# Patient Record
Sex: Female | Born: 1960 | Race: White | Hispanic: No | Marital: Married | Smoking: Never smoker
Health system: Southern US, Community
[De-identification: ages and names within clinical notes are randomized; demographics above are authoritative.]

## PROBLEM LIST (undated history)

## (undated) DIAGNOSIS — I10 Essential (primary) hypertension: Secondary | ICD-10-CM

## (undated) HISTORY — PX: BREAST SURGERY: SHX581

---

## 2004-05-06 HISTORY — PX: AUGMENTATION MAMMAPLASTY: SUR837

## 2011-03-19 ENCOUNTER — Other Ambulatory Visit: Payer: Self-pay | Admitting: Nurse Practitioner

## 2011-03-19 DIAGNOSIS — Z1231 Encounter for screening mammogram for malignant neoplasm of breast: Secondary | ICD-10-CM

## 2011-03-28 ENCOUNTER — Other Ambulatory Visit: Payer: Self-pay

## 2011-03-28 ENCOUNTER — Emergency Department (HOSPITAL_COMMUNITY): Payer: Managed Care, Other (non HMO)

## 2011-03-28 ENCOUNTER — Emergency Department (HOSPITAL_COMMUNITY)
Admission: EM | Admit: 2011-03-28 | Discharge: 2011-03-28 | Disposition: A | Discharge status: Home / Self Care | Payer: Managed Care, Other (non HMO) | Attending: Emergency Medicine | Admitting: Emergency Medicine

## 2011-03-28 ENCOUNTER — Encounter: Payer: Self-pay | Admitting: *Deleted

## 2011-03-28 DIAGNOSIS — T50905A Adverse effect of unspecified drugs, medicaments and biological substances, initial encounter: Secondary | ICD-10-CM | POA: Insufficient documentation

## 2011-03-28 DIAGNOSIS — R079 Chest pain, unspecified: Secondary | ICD-10-CM | POA: Insufficient documentation

## 2011-03-28 DIAGNOSIS — R0602 Shortness of breath: Secondary | ICD-10-CM | POA: Insufficient documentation

## 2011-03-28 DIAGNOSIS — R259 Unspecified abnormal involuntary movements: Secondary | ICD-10-CM | POA: Insufficient documentation

## 2011-03-28 DIAGNOSIS — IMO0002 Reserved for concepts with insufficient information to code with codable children: Secondary | ICD-10-CM | POA: Insufficient documentation

## 2011-03-28 DIAGNOSIS — J45909 Unspecified asthma, uncomplicated: Secondary | ICD-10-CM | POA: Insufficient documentation

## 2011-03-28 DIAGNOSIS — K117 Disturbances of salivary secretion: Secondary | ICD-10-CM | POA: Insufficient documentation

## 2011-03-28 MED ORDER — SODIUM CHLORIDE 0.9 % IV BOLUS (SEPSIS)
1000.0000 mL | Freq: Once | INTRAVENOUS | Status: AC
  Administered 2011-03-28: 1000 mL via INTRAVENOUS

## 2011-03-28 MED ORDER — LORAZEPAM 1 MG PO TABS
0.5000 mg | ORAL_TABLET | Freq: Once | ORAL | Status: AC
  Administered 2011-03-28: 0.5 mg via ORAL
  Filled 2011-03-28: qty 1

## 2011-03-28 NOTE — ED Notes (Signed)
Pt discharged home. Had no further questions regarding instructions and follow up. Denies pain at time of discharge.

## 2011-03-28 NOTE — ED Notes (Signed)
Pt resting quietly at the time. Remains on cardiac monitor. States 2/10 chest pressure. Family at bedside. No signs of distress.

## 2011-03-28 NOTE — ED Provider Notes (Signed)
History    a generally healthy patient with no history of cardiac etiology is presenting to the ED with an acute onset of chest pressure and shortness of breath. Patient states she is experiencing a pressure sensation to the mid chest and a sensation that she can't catch her breath. She also complains of dry mouth, agitation, and body tremors. She denies fever, headache, nausea, vomiting, abdominal pain, back pain, dysuria, rash. She does admits to taking a new appetite suppressants, phentermine, for the past several days. She has taken a total of 5 pills in the span of a few days.  Patient states she has a history of mild asthma but it is well controlled. She denies taking birth control pill, recent surgery, recent travel, or prolonged bed rest.  CSN: [id number] Arrival date & time: 03/28/2011  5:38 PM   First MD Initiated Contact with Patient 03/28/11 1800      Chief Complaint  Patient presents with  . Chest Pain  . Shortness of Breath    (Consider location/radiation/quality/duration/timing/severity/associated sxs/prior treatment) HPI  Past Medical History  Diagnosis Date  . Asthma     History reviewed. No pertinent past surgical history.  No family history on file.  History  Substance Use Topics  . Smoking status: Never Smoker   . Smokeless tobacco: Not on file  . Alcohol Use: Yes    OB History    Grav Para Term Preterm Abortions TAB SAB Ect Mult Living                  Review of Systems  All other systems reviewed and are negative.    Allergies  Review of patient's allergies indicates no known allergies.  Home Medications   Current Outpatient Rx  Name Route Sig Dispense Refill  . ARGYLE LEFT VENTRICULAR CATH MISC Does not apply by Does not apply route.        BP 172/95  Pulse 99  Temp 98.1 F (36.7 C)  SpO2 100%  Physical Exam  Constitutional: She is oriented to person, place, and time. She appears well-developed and well-nourished. No distress.    HENT:  Head: Normocephalic and atraumatic.  Eyes: Conjunctivae are normal.  Neck: Normal range of motion. Neck supple.  Cardiovascular: Normal rate and regular rhythm.  Exam reveals no gallop and no friction rub.   No murmur heard. Pulmonary/Chest: Effort normal. No respiratory distress. She has no wheezes.  Abdominal: Soft. Bowel sounds are normal. There is no tenderness.  Musculoskeletal: Normal range of motion.  Neurological: She is alert and oriented to person, place, and time.    ED Course  Procedures (including critical care time)  Labs Reviewed - No data to display No results found.   No diagnosis found.   Date: 03/28/2011  Rate: 95  Rhythm: normal sinus rhythm  QRS Axis: normal  Intervals: normal  ST/T Wave abnormalities: normal  Conduction Disutrbances:none  Narrative Interpretation: mild tachycardia  Old EKG Reviewed: none available    MDM  Patient symptoms is likely related to reaction from the phentermine. Her EKG is essentially normal, and she is satting at 100%  on O2.  She is hypertensive, tachycardic. Supportive treatment with normal saline fluid, Ativan, and reassurance given. I will continue to monitor patient care. I have discussed this with my attending.   9:41 PM Pt is back to her normal baseline.  She felt comfortable to go home.  Her sxs is related to Phentermine.  I recommend d/c this med and f/u  with her PCP.  Pt voice understanding.      Fayrene Helper, PA 03/28/11 2142

## 2011-03-28 NOTE — ED Notes (Signed)
Patient with c/o midsternal chest pain that is radiating to he right arm and she states that her right arm is tingling.  Pain also states that she is experiencing shortness of breath.

## 2011-03-28 NOTE — ED Notes (Signed)
Pt presents to department for evaluation of midsternal chest pain radiating to R arm. Onset yesterday. Pt now states 3/10 "pressure" sensation. Onset yesterday. Also states SOB. Pt states she has recently starting taking a type of diet pill. States symptoms started after several days of taking these pills. Pt states "I feel like my heart is going to beat out of my chest sometimes." respirations unlabored. Skin warm and dry. Pt conscious alert and oriented x4. No signs of distress at present.

## 2011-03-29 NOTE — ED Provider Notes (Signed)
Medical screening examination/treatment/procedure(s) were performed by non-physician practitioner and as supervising physician I was immediately available for consultation/collaboration.   Geoffery Lyons, MD 03/29/11 [phone]

## 2011-04-16 ENCOUNTER — Ambulatory Visit
Admission: RE | Admit: 2011-04-16 | Discharge: 2011-04-16 | Disposition: A | Discharge status: Home / Self Care | Payer: Managed Care, Other (non HMO) | Source: Ambulatory Visit | Attending: Nurse Practitioner | Admitting: Nurse Practitioner

## 2011-04-16 DIAGNOSIS — Z1231 Encounter for screening mammogram for malignant neoplasm of breast: Secondary | ICD-10-CM

## 2013-04-22 ENCOUNTER — Emergency Department (HOSPITAL_COMMUNITY): Payer: Self-pay

## 2013-04-22 ENCOUNTER — Encounter (HOSPITAL_COMMUNITY): Payer: Self-pay | Admitting: Emergency Medicine

## 2013-04-22 ENCOUNTER — Emergency Department (HOSPITAL_COMMUNITY)
Admission: EM | Admit: 2013-04-22 | Discharge: 2013-04-22 | Disposition: A | Discharge status: Home / Self Care | Payer: Self-pay | Attending: Emergency Medicine | Admitting: Emergency Medicine

## 2013-04-22 DIAGNOSIS — J45909 Unspecified asthma, uncomplicated: Secondary | ICD-10-CM | POA: Insufficient documentation

## 2013-04-22 DIAGNOSIS — R0789 Other chest pain: Secondary | ICD-10-CM | POA: Insufficient documentation

## 2013-04-22 DIAGNOSIS — Z79899 Other long term (current) drug therapy: Secondary | ICD-10-CM | POA: Insufficient documentation

## 2013-04-22 DIAGNOSIS — F430 Acute stress reaction: Secondary | ICD-10-CM | POA: Insufficient documentation

## 2013-04-22 DIAGNOSIS — IMO0002 Reserved for concepts with insufficient information to code with codable children: Secondary | ICD-10-CM | POA: Insufficient documentation

## 2013-04-22 LAB — CBC
HCT: 44 % (ref 36.0–46.0)
Hemoglobin: 14.7 g/dL (ref 12.0–15.0)
MCH: 28.8 pg (ref 26.0–34.0)
MCHC: 33.4 g/dL (ref 30.0–36.0)
MCV: 86.3 fL (ref 78.0–100.0)
RDW: 13.1 % (ref 11.5–15.5)

## 2013-04-22 LAB — BASIC METABOLIC PANEL
BUN: 16 mg/dL (ref 6–23)
CO2: 24 mEq/L (ref 19–32)
Calcium: 10.3 mg/dL (ref 8.4–10.5)
GFR calc non Af Amer: >90 mL/min (ref >90)
Glucose, Bld: 102 mg/dL — ABNORMAL HIGH (ref 70–99)
Sodium: 138 mEq/L (ref 135–145)

## 2013-04-22 LAB — POCT I-STAT TROPONIN I

## 2013-04-22 MED ORDER — ASPIRIN 325 MG PO TABS
325.0000 mg | ORAL_TABLET | ORAL | Status: AC
  Administered 2013-04-22: 325 mg via ORAL
  Filled 2013-04-22: qty 1

## 2013-04-22 MED ORDER — IBUPROFEN 800 MG PO TABS: 800.0000 mg | ORAL_TABLET | Freq: Three times a day (TID) | ORAL | Status: DC | PRN

## 2013-04-22 NOTE — ED Notes (Signed)
Pt complains of chest pressure and sob since last night. Pt states pain is midline. Denies nausea or lightheadedness.

## 2013-04-22 NOTE — ED Provider Notes (Signed)
CSN: [id number]     Arrival date & time 04/22/13  1807 History   First MD Initiated Contact with Patient 04/22/13 2021     Chief Complaint  Patient presents with  . Chest Pain   (Consider location/radiation/quality/duration/timing/severity/associated sxs/prior Treatment) HPI Patient presents emergency department with a one-day history of chest discomfort in the center of her chest.  The patient, states, that it has been constant.  She feels nauseous take a deeper breath, but does not have true shortness of breath.  The patient, states she does not have any calf pain, nausea, vomiting, abdominal pain, back pain, headache, blurred vision, dizziness, weakness, neck pain, fever, cough, or syncope.  The patient, states she has been under increased amount of stress recently.  Patient's patient is a history of asthma, but has not felt any wheezing.  He states nothing seems to make her condition, better or worse.  The patient does not have any exertional symptoms Past Medical History  Diagnosis Date  . Asthma    History reviewed. No pertinent past surgical history. History reviewed. No pertinent family history. History  Substance Use Topics  . Smoking status: Never Smoker   . Smokeless tobacco: Not on file  . Alcohol Use: Yes   OB History   Grav Para Term Preterm Abortions TAB SAB Ect Mult Living                 Review of Systems All other systems negative except as documented in the HPI. All pertinent positives and negatives as reviewed in the HPI. Allergies  Review of patient's allergies indicates no known allergies.  Home Medications   Current Outpatient Rx  Name  Route  Sig  Dispense  Refill  . albuterol (PROVENTIL HFA;VENTOLIN HFA) 108 (90 BASE) MCG/ACT inhaler   Inhalation   Inhale 2 puffs into the lungs every 6 (six) hours as needed for wheezing or shortness of breath. For shortness of breath/wheezing         . Fluticasone-Salmeterol (ADVAIR) 250-50 MCG/DOSE AEPB    Inhalation   Inhale 1 puff into the lungs every 12 (twelve) hours as needed (for asthma). For wheezing/shortness of breath          BP 150/98  Pulse 70  Temp(Src) 98.5 F (36.9 C) (Oral)  Resp 20  SpO2 97% Physical Exam  Nursing note and vitals reviewed. Constitutional: She is oriented to person, place, and time. She appears well-developed and well-nourished. No distress.  HENT:  Head: Normocephalic and atraumatic.  Mouth/Throat: Oropharynx is clear and moist.  Eyes: Pupils are equal, round, and reactive to light.  Neck: Normal range of motion. Neck supple.  Cardiovascular: Normal rate, regular rhythm and normal heart sounds.  Exam reveals no gallop and no friction rub.   No murmur heard. Pulmonary/Chest: Effort normal and breath sounds normal. No respiratory distress. She exhibits no tenderness.  Neurological: She is alert and oriented to person, place, and time. She exhibits normal muscle tone. Coordination normal.  Skin: Skin is warm and dry. No erythema.    ED Course  Procedures (including critical care time) Labs Review Labs Reviewed  BASIC METABOLIC PANEL - Abnormal; Notable for the following:    Glucose, Bld 102 (*)    All other components within normal limits  CBC  PRO B NATRIURETIC PEPTIDE  POCT I-STAT TROPONIN I  POCT I-STAT TROPONIN I   Imaging Review Dg Chest 2 View  04/22/2013   CLINICAL DATA:  Chest pain  EXAM: CHEST  2  VIEW  COMPARISON:  None.  FINDINGS: There is no edema or consolidation. There is slight scarring in the left upper lobe. Heart size and pulmonary vascularity are normal. No adenopathy. There is lower thoracic and upper lumbar levoscoliosis with rotatory component.  IMPRESSION: Marked scoliosis. No edema or consolidation. Mild scarring left upper lobe.   Electronically Signed   By: Bretta Bang M.D.   On: 04/22/2013 18:57    EKG Interpretation    Date/Time:  Thursday April 22 2013 18:17:26 EST Ventricular Rate:  72 PR  Interval:  169 QRS Duration: 74 QT Interval:  407 QTC Calculation: 445 R Axis:   52 Text Interpretation:  Sinus rhythm Anteroseptal infarct, age indeterminate Baseline wander in lead(s) V3 No previous ECGs available Confirmed by YAO  MD, DAVID [phone]) on 04/22/2013 6:20:25 PM           patient is PERC negative and has had around 24 hours of constant, chest discomfort.  She states that some of pain, but just feels somewhat like a pressure sensation.  Patient, states, that she does not have any cardiac history has never had high blood pressure.  The patient, states, that she does have an appointment with her doctor in the near future.  Advised her, that she'll need close followup to ensure that this is not cardiac related, as this still could be the possibility advised to testing today, was normal, but that is not helpful picture of her heart health.  She is advised to return here as needed  Carlyle Dolly, PA-C 04/23/13 0107

## 2013-04-23 NOTE — ED Provider Notes (Signed)
Medical screening examination/treatment/procedure(s) were performed by non-physician practitioner and as supervising physician I was immediately available for consultation/collaboration.  EKG Interpretation    Date/Time:  Thursday April 22 2013 18:17:26 EST Ventricular Rate:  72 PR Interval:  169 QRS Duration: 74 QT Interval:  407 QTC Calculation: 445 R Axis:   52 Text Interpretation:  Sinus rhythm Anteroseptal infarct, age indeterminate Baseline wander in lead(s) V3 No previous ECGs available Confirmed by YAO  MD, DAVID [phone]) on 04/22/2013 6:20:25 PM              Lyanne Co, MD 04/23/13 [phone]

## 2013-05-05 IMAGING — CR DG CHEST 2V
2 series · 2 of 2 positions shown · non-contrast
Comparison: None.

CLINICAL DATA: 50-year-old female with shortness of breath and
chest pain.

CHEST - 2 VIEW

[w chest pa]
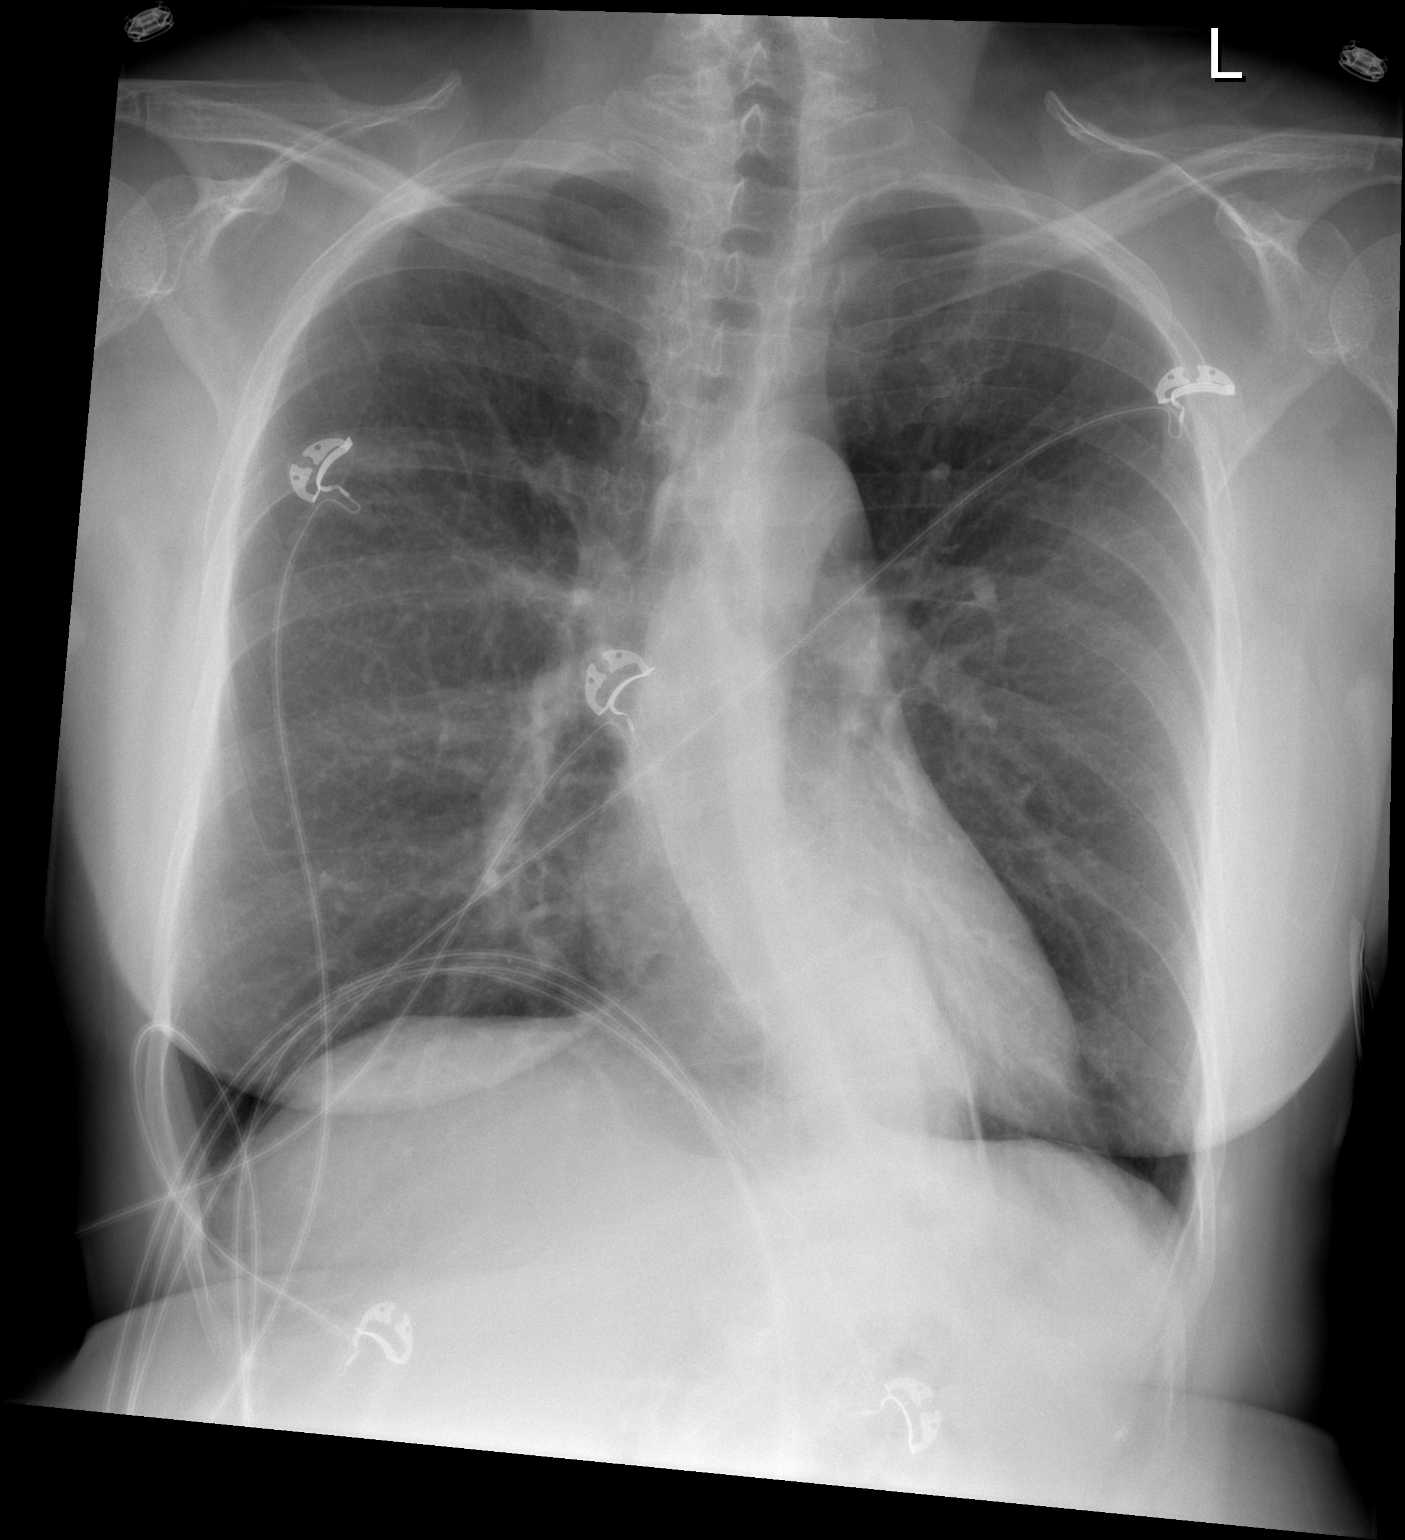

[w chest lat]
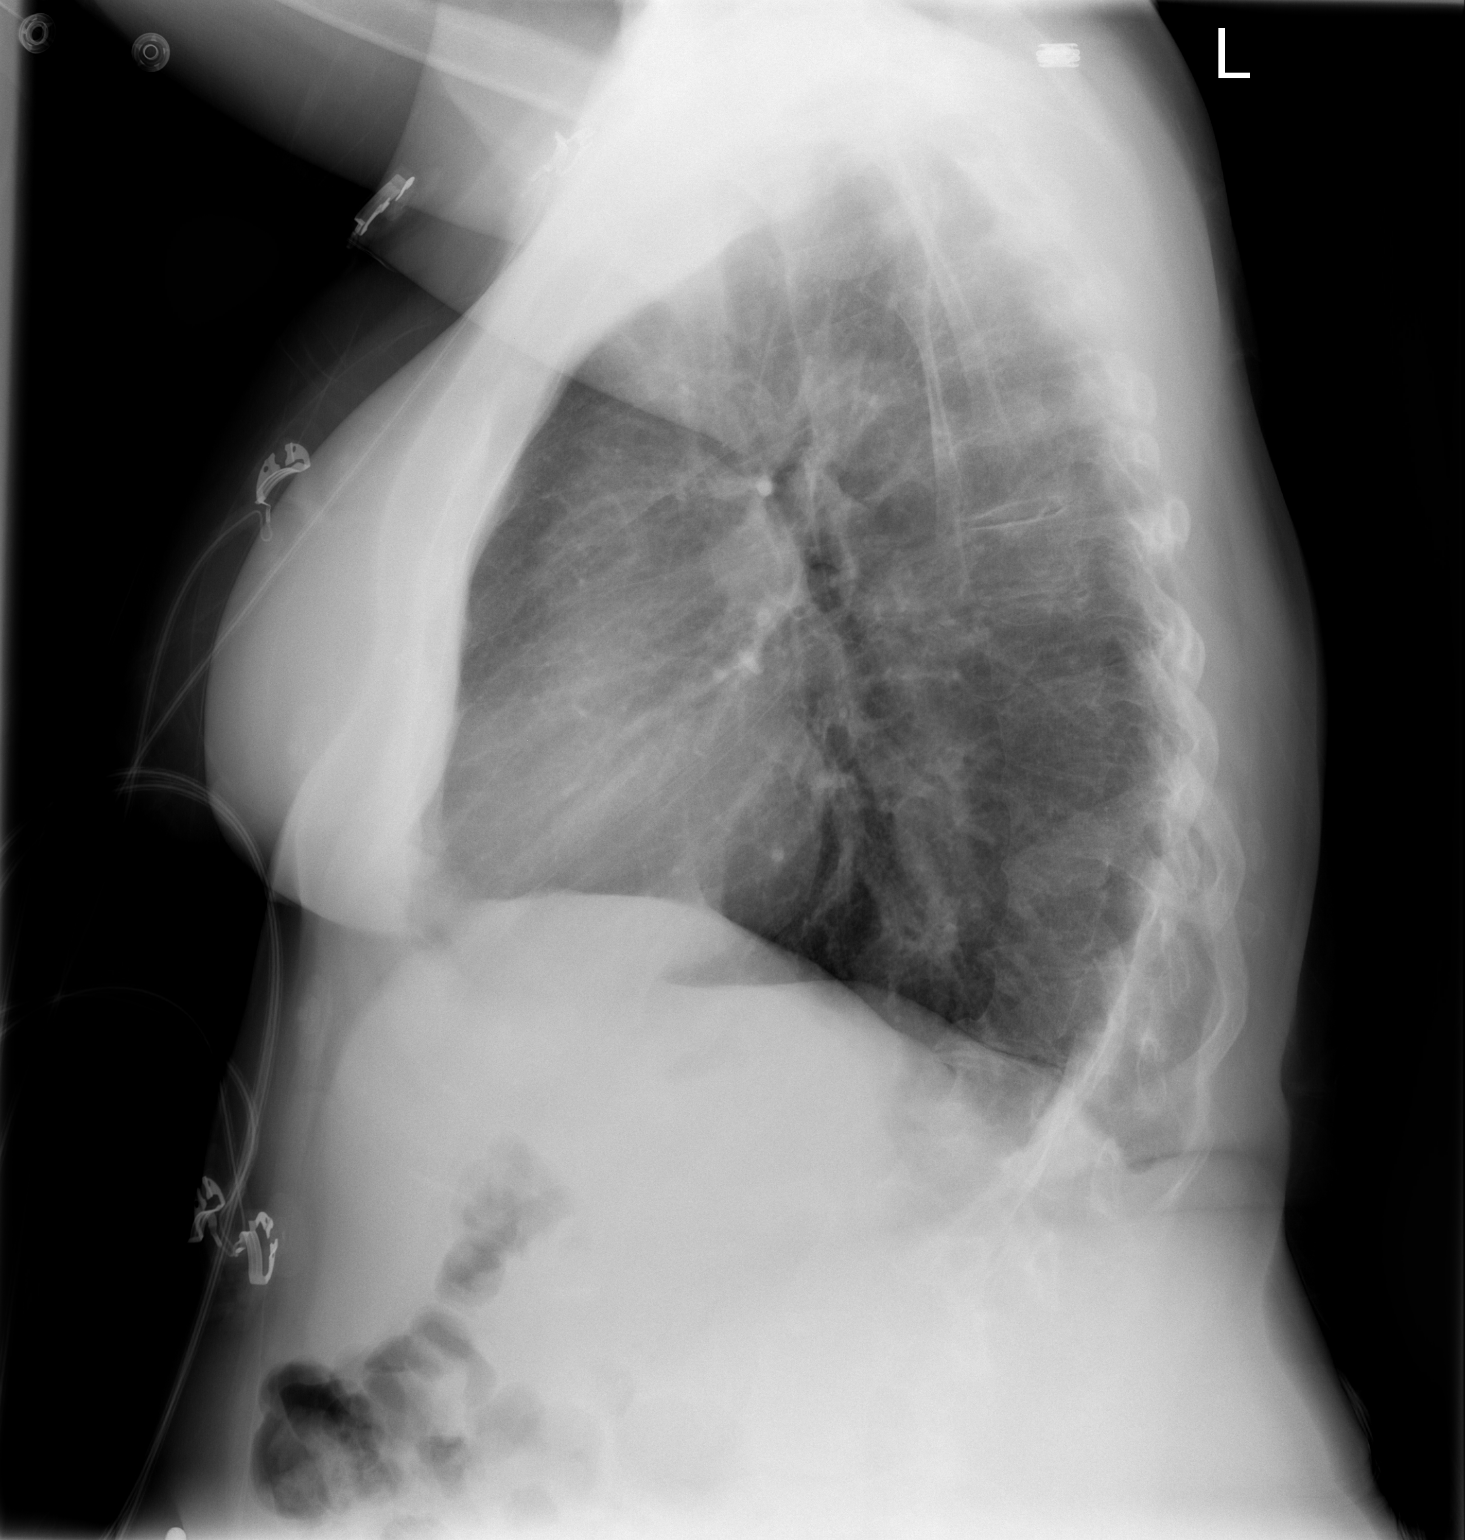

[2 of 2 positions shown; findings below may reference images not displayed]

FINDINGS: Moderate to severe thoracolumbar scoliosis.  Cardiac size
and mediastinal contours are within normal limits.  Somewhat large
lung volumes.  No pneumothorax, pulmonary edema, pleural effusion
or confluent pulmonary opacity. Visualized tracheal air column is
within normal limits.  Negative visualized bowel gas pattern.
IMPRESSION: Suspect some pulmonary hyperinflation. No acute cardiopulmonary
abnormality.  Scoliosis.

## 2013-05-12 ENCOUNTER — Other Ambulatory Visit: Payer: Self-pay | Admitting: Family Medicine

## 2013-05-12 DIAGNOSIS — Z1231 Encounter for screening mammogram for malignant neoplasm of breast: Secondary | ICD-10-CM

## 2013-05-12 DIAGNOSIS — E559 Vitamin D deficiency, unspecified: Secondary | ICD-10-CM

## 2013-05-12 DIAGNOSIS — M8580 Other specified disorders of bone density and structure, unspecified site: Secondary | ICD-10-CM

## 2013-05-28 ENCOUNTER — Ambulatory Visit
Admission: RE | Admit: 2013-05-28 | Discharge: 2013-05-28 | Disposition: A | Discharge status: Home / Self Care | Payer: BC Managed Care – PPO | Source: Ambulatory Visit | Attending: Family Medicine | Admitting: Family Medicine

## 2013-05-28 DIAGNOSIS — M8580 Other specified disorders of bone density and structure, unspecified site: Secondary | ICD-10-CM

## 2013-05-28 DIAGNOSIS — Z1231 Encounter for screening mammogram for malignant neoplasm of breast: Secondary | ICD-10-CM

## 2013-05-28 DIAGNOSIS — E559 Vitamin D deficiency, unspecified: Secondary | ICD-10-CM

## 2014-02-18 ENCOUNTER — Other Ambulatory Visit: Payer: Self-pay | Admitting: Family Medicine

## 2014-09-02 ENCOUNTER — Other Ambulatory Visit: Payer: Self-pay | Admitting: Family Medicine

## 2014-09-02 DIAGNOSIS — Z1231 Encounter for screening mammogram for malignant neoplasm of breast: Secondary | ICD-10-CM

## 2014-11-01 ENCOUNTER — Ambulatory Visit
Admission: RE | Admit: 2014-11-01 | Discharge: 2014-11-01 | Disposition: A | Discharge status: Home / Self Care | Payer: BLUE CROSS/BLUE SHIELD | Source: Ambulatory Visit | Attending: Family Medicine | Admitting: Family Medicine

## 2014-11-01 DIAGNOSIS — Z1231 Encounter for screening mammogram for malignant neoplasm of breast: Secondary | ICD-10-CM

## 2015-08-01 ENCOUNTER — Encounter (HOSPITAL_COMMUNITY): Payer: Self-pay | Admitting: Emergency Medicine

## 2015-08-01 ENCOUNTER — Emergency Department (HOSPITAL_COMMUNITY)
Admission: EM | Admit: 2015-08-01 | Discharge: 2015-08-01 | Disposition: A | Discharge status: Home / Self Care | Payer: Self-pay | Source: Home / Self Care | Attending: Family Medicine | Admitting: Family Medicine

## 2015-08-01 DIAGNOSIS — J069 Acute upper respiratory infection, unspecified: Secondary | ICD-10-CM

## 2015-08-01 HISTORY — DX: Essential (primary) hypertension: I10

## 2015-08-01 MED ORDER — ALBUTEROL SULFATE HFA 108 (90 BASE) MCG/ACT IN AERS: 2.0000 | INHALATION_SPRAY | RESPIRATORY_TRACT | Status: DC | PRN

## 2015-08-01 MED ORDER — GUAIFENESIN ER 600 MG PO TB12: 1200.0000 mg | ORAL_TABLET | Freq: Two times a day (BID) | ORAL | Status: DC

## 2015-08-01 MED ORDER — AZITHROMYCIN 250 MG PO TABS: 250.0000 mg | ORAL_TABLET | Freq: Every day | ORAL | Status: DC

## 2015-08-01 NOTE — ED Notes (Signed)
Fever, achy, coughing up phlegm-yellow green in color.  Nagging cough, particularly at night, interrupting sleep, chest soreness with coughing

## 2015-08-01 NOTE — ED Provider Notes (Signed)
CSN: [id number]     Arrival date & time 08/01/15  1848 History   First MD Initiated Contact with Patient 08/01/15 2043     No chief complaint on file.  (Consider location/radiation/quality/duration/timing/severity/associated sxs/prior Treatment) The history is provided by the patient. No language interpreter was used.   Patient presents with five days of cough and chest congestion; initially with subjective fever on days 1 and 2 of illness. Husband is ill with similar sxs; daughter (age 56) is getting over similar illness. Patient received her flu shot this season.    She has a history of asthma, onset while pregnant with her daughter.  She is a never-smoker.  Uncertain triggers. Used to use Advair for controller medicine but became too expensive (not using x 2 months).    Past Medical History  Diagnosis Date  . Asthma   . Hypertension    Past Surgical History  Procedure Laterality Date  . Cesarean section     No family history on file. Social History  Substance Use Topics  . Smoking status: Never Smoker   . Smokeless tobacco: None  . Alcohol Use: Yes   OB History    No data available     Review of Systems  Constitutional: Positive for fatigue. Negative for fever, chills and diaphoresis.  HENT: Positive for congestion. Negative for rhinorrhea and sinus pressure.   Respiratory: Positive for cough. Negative for chest tightness, shortness of breath and wheezing.   Gastrointestinal: Negative for nausea, vomiting and diarrhea.  Neurological: Negative for headaches.    Allergies  Review of patient's allergies indicates no known allergies.  Home Medications   Prior to Admission medications   Medication Sig Start Date End Date Taking? Authorizing Provider  TRIAMTERENE PO Take by mouth.   Yes Historical Provider, MD  albuterol (PROVENTIL HFA;VENTOLIN HFA) 108 (90 BASE) MCG/ACT inhaler Inhale 2 puffs into the lungs every 6 (six) hours as needed for wheezing or shortness of  breath. For shortness of breath/wheezing    Historical Provider, MD  albuterol (PROVENTIL HFA;VENTOLIN HFA) 108 (90 Base) MCG/ACT inhaler Inhale 2 puffs into the lungs every 4 (four) hours as needed for wheezing or shortness of breath. 08/01/15   Barbaraann BarthelJames O Mattie Nordell, MD  azithromycin (ZITHROMAX) 250 MG tablet Take 1 tablet (250 mg total) by mouth daily. Take first 2 tablets together, then 1 every day until finished. 08/01/15   Barbaraann BarthelJames O Guhan Bruington, MD  Fluticasone-Salmeterol (ADVAIR) 250-50 MCG/DOSE AEPB Inhale 1 puff into the lungs every 12 (twelve) hours as needed (for asthma). For wheezing/shortness of breath    Historical Provider, MD  guaiFENesin (MUCINEX) 600 MG 12 hr tablet Take 2 tablets (1,200 mg total) by mouth 2 (two) times daily. 08/01/15   Barbaraann BarthelJames O Aster Eckrich, MD  ibuprofen (ADVIL,MOTRIN) 800 MG tablet Take 1 tablet (800 mg total) by mouth every 8 (eight) hours as needed. 04/22/13   Charlestine Nighthristopher Lawyer, PA-C   Meds Ordered and Administered this Visit  Medications - No data to display  BP 150/96 mmHg  Pulse 88  Temp(Src) 99.7 F (37.6 C)  Resp 16 No data found.   Physical Exam  Constitutional: She appears well-developed and well-nourished. No distress.  HENT:  Head: Normocephalic and atraumatic.  Right Ear: External ear normal.  Left Ear: External ear normal.  Nose: Nose normal.  Mouth/Throat: Oropharyngeal exudate present.  No frontal or maxillary sinus tenderness  Neck: Normal range of motion. Neck supple.  Cardiovascular: Normal rate, regular rhythm and normal heart sounds.  Pulmonary/Chest: Effort normal and breath sounds normal. No respiratory distress. She has no wheezes. She has no rales. She exhibits no tenderness.  Abdominal: Soft.  Lymphadenopathy:    She has no cervical adenopathy.  Skin: She is not diaphoretic.    ED Course  Procedures (including critical care time)  Labs Review Labs Reviewed - No data to display  Imaging Review No results found.   Visual Acuity  Review  Right Eye Distance:   Left Eye Distance:   Bilateral Distance:    Right Eye Near:   Left Eye Near:    Bilateral Near:         MDM   1. Viral upper respiratory infection    Upper respiratory infection, doubt true influenza.   Supportive care with mucolytics, hydration.  No findings to suggest asthma exacerbation.   She asks about azithromycin.  I have explained why I do not believe this will be helpful for her. Rx given for her to use only if meets certain parameters discussed in visit.   To follow with Dr Mardelle Matte, her primary care physician.   Paula Compton, MD    Barbaraann Barthel, MD 08/01/15 2108

## 2015-08-01 NOTE — Discharge Instructions (Signed)
It is a pleasure to see you today.  I believe your symptoms are due to a viral upper respiratory infection.    Mucinex 600mg  tablets, take 2 tablets by mouth twice daily with increased oral hydration.   I am giving you the prescription for azithromycin, do not take unless you begin to have worsening coughing paroxysms.  I am giving you a prescription for albuterol inhaler in case your current one runs out.

## 2016-06-13 ENCOUNTER — Encounter (HOSPITAL_COMMUNITY): Payer: Self-pay | Admitting: Emergency Medicine

## 2016-06-13 ENCOUNTER — Ambulatory Visit (HOSPITAL_COMMUNITY)
Admission: EM | Admit: 2016-06-13 | Discharge: 2016-06-13 | Disposition: A | Discharge status: Home / Self Care | Payer: Self-pay | Attending: Emergency Medicine | Admitting: Emergency Medicine

## 2016-06-13 DIAGNOSIS — H8111 Benign paroxysmal vertigo, right ear: Secondary | ICD-10-CM

## 2016-06-13 DIAGNOSIS — Z760 Encounter for issue of repeat prescription: Secondary | ICD-10-CM

## 2016-06-13 MED ORDER — ALBUTEROL SULFATE HFA 108 (90 BASE) MCG/ACT IN AERS: 2.0000 | INHALATION_SPRAY | RESPIRATORY_TRACT | 0 refills | Status: AC | PRN

## 2016-06-13 MED ORDER — AEROCHAMBER PLUS MISC: 2 refills | Status: DC

## 2016-06-13 MED ORDER — TRIAMTERENE-HCTZ 37.5-25 MG PO TABS: 1.0000 | ORAL_TABLET | Freq: Every day | ORAL | 0 refills | Status: AC

## 2016-06-13 MED ORDER — VALACYCLOVIR HCL 500 MG PO TABS: 500.0000 mg | ORAL_TABLET | Freq: Every day | ORAL | 0 refills | Status: AC

## 2016-06-13 MED ORDER — MECLIZINE HCL 25 MG PO TABS: 25.0000 mg | ORAL_TABLET | Freq: Three times a day (TID) | ORAL | 0 refills | Status: DC | PRN

## 2016-06-13 NOTE — ED Triage Notes (Signed)
Pt c/o dizziness onset this am associated w/nausea and emesis. ... Dizziness increases when lying down  Denies fevers, cold sx  Also needing BP meds refilled Last had BP meds x1 month  A&O x4... NAD

## 2016-06-13 NOTE — ED Provider Notes (Signed)
HPI  SUBJECTIVE:  Betty Johnson is a 56 y.o. female who presents with dizziness described as the room spinning around her with nausea and vomiting starting this morning. She states that she has had 4-5 episodes each lasting several minutes. She has tried changing her position. symptoms are better with sitting up and sitting still, worse with bending forward, lying down. It is not associated with turning her head. She denies headache, visual changes, arm or leg weakness, facial droop, dysarthria. No chest pain, shortness of breath, palpitations, syncope. No tenderness, recent viral infection, antibiotics. No excess NSAID use. She does not take any medicines on a regular basis. She's never had symptoms like this before. She has a past medical history of hypertension, but states that she ran out of her blood pressure medicine 6 weeks ago. States that she cannot afford to see her PMD. She has no past medical history of vertigo, arrhythmia, stroke, coronary disease, MI, peripheral vascular disease, aneurysm, diabetes. Family history negative for stroke. ZOX:WRUE,[id number]:ANDY,CAMILLE L, MD    Past Medical History:  Diagnosis Date  . Asthma   . Hypertension     Past Surgical History:  Procedure Laterality Date  . CESAREAN SECTION      History reviewed. No pertinent family history.  Social History  Substance Use Topics  . Smoking status: Never Smoker  . Smokeless tobacco: Never Used  . Alcohol use Yes    No current facility-administered medications for this encounter.   Current Outpatient Prescriptions:  .  albuterol (PROVENTIL HFA;VENTOLIN HFA) 108 (90 Base) MCG/ACT inhaler, Inhale 2 puffs into the lungs every 4 (four) hours as needed for wheezing or shortness of breath., Disp: 1 Inhaler, Rfl: 0 .  Fluticasone-Salmeterol (ADVAIR) 250-50 MCG/DOSE AEPB, Inhale 1 puff into the lungs every 12 (twelve) hours as needed (for asthma). For wheezing/shortness of breath, Disp: , Rfl:  .  meclizine (ANTIVERT) 25  MG tablet, Take 1 tablet (25 mg total) by mouth 3 (three) times daily as needed for dizziness., Disp: 30 tablet, Rfl: 0 .  Spacer/Aero-Holding Chambers (AEROCHAMBER PLUS) inhaler, Use as instructed, Disp: 1 each, Rfl: 2 .  triamterene-hydrochlorothiazide (MAXZIDE-25) 37.5-25 MG tablet, Take 1 tablet by mouth daily., Disp: 30 tablet, Rfl: 0 .  valACYclovir (VALTREX) 500 MG tablet, Take 1 tablet (500 mg total) by mouth daily., Disp: 30 tablet, Rfl: 0  No Known Allergies   ROS  As noted in HPI.   Physical Exam  BP 141/86 (BP Location: Right Arm)   Pulse 89   Temp 98.2 F (36.8 C) (Oral)   Resp 18   SpO2 98%    Vitals:   06/13/16 1337 06/13/16 1339  BP: 141/86 141/86  Pulse: 75 89  Resp: 18 18  Temp: 98.2 F (36.8 C) 98.2 F (36.8 C)  TempSrc: Oral Oral  SpO2: 99% 98%    Constitutional: Well developed, well nourished, no acute distress Eyes: PERRL, EOMI, conjunctiva normal bilaterally HENT: Normocephalic, atraumatic,mucus membranes moist Respiratory: Clear to auscultation bilaterally, no rales, no wheezing, no rhonchi Cardiovascular: Normal rate and rhythm, no murmurs, no gallops, no rubs. No carotid bruits  GI: Soft, nondistended, normal bowel sounds, nontender, no rebound, no guarding Back: no CVAT skin: No rash, skin intact Musculoskeletal: No edema, no tenderness, no deformities Neurologic: Alert & oriented x 3, CN II-XII intact, no motor deficits, sensation grossly intact, finger-nose, heel shin within normal limits. Dix Hallpike positive on the right side. Negative Romberg, tandem gait steady. Psychiatric: Speech and behavior appropriate  ED Course   Medications - No data to display  No orders of the defined types were placed in this encounter.  No results found for this or any previous visit (from the past 24 hour(s)). No results found.  ED Clinical Impression  Benign paroxysmal positional vertigo of right ear  Medication refill   ED  Assessment/Plan   Presentation most consistent with benign paroxysmal positional vertigo. Performed the Epley maneuver with improvement in her symptoms. Doubt stroke or cardiac cause of her symptoms.  Patient also requesting refill multiple medications including her triamterene/HCTZ, valacyclovir and albuterol. We'll prescribe spacer as well. She will need to follow up with her primary care physician for continued care.  Discussed  MDM, plan and followup with patient. Discussed sn/sx that should prompt return to the ED. Patient agrees with plan.   Meds ordered this encounter  Medications  . Spacer/Aero-Holding Chambers (AEROCHAMBER PLUS) inhaler    Sig: Use as instructed    Dispense:  1 each    Refill:  2  . albuterol (PROVENTIL HFA;VENTOLIN HFA) 108 (90 Base) MCG/ACT inhaler    Sig: Inhale 2 puffs into the lungs every 4 (four) hours as needed for wheezing or shortness of breath.    Dispense:  1 Inhaler    Refill:  0  . valACYclovir (VALTREX) 500 MG tablet    Sig: Take 1 tablet (500 mg total) by mouth daily.    Dispense:  30 tablet    Refill:  0  . triamterene-hydrochlorothiazide (MAXZIDE-25) 37.5-25 MG tablet    Sig: Take 1 tablet by mouth daily.    Dispense:  30 tablet    Refill:  0  . meclizine (ANTIVERT) 25 MG tablet    Sig: Take 1 tablet (25 mg total) by mouth 3 (three) times daily as needed for dizziness.    Dispense:  30 tablet    Refill:  0    *This clinic note was created using Scientist, clinical (histocompatibility and immunogenetics). Therefore, there may be occasional mistakes despite careful proofreading.  ?   Domenick Gong, MD 06/14/16 [phone]

## 2016-09-09 ENCOUNTER — Ambulatory Visit (HOSPITAL_COMMUNITY)
Admission: EM | Admit: 2016-09-09 | Discharge: 2016-09-09 | Disposition: A | Discharge status: Home / Self Care | Payer: Self-pay | Attending: Internal Medicine | Admitting: Internal Medicine

## 2016-09-09 ENCOUNTER — Encounter (HOSPITAL_COMMUNITY): Payer: Self-pay | Admitting: Emergency Medicine

## 2016-09-09 DIAGNOSIS — H9201 Otalgia, right ear: Secondary | ICD-10-CM

## 2016-09-09 NOTE — ED Triage Notes (Signed)
Here for right ear pain/swelling onset 3 days  Denies fevers, chills  Taking Ibup w/no relief.   A&O x4... NAD

## 2016-09-09 NOTE — Discharge Instructions (Signed)
There are no signs of infection in your ear, there is fluid in your ear causing your pain. I recommend taking your zyrtec every day, along with Flonase, two sprays each nostril once a day. If your symptoms persist or fail to resolve, follow up with your primary care provider or return to clinic as needed.

## 2016-09-09 NOTE — ED Provider Notes (Signed)
CSN: [id number]     Arrival date & time 09/09/16  1001 History   First MD Initiated Contact with Patient 09/09/16 1051     Chief Complaint  Patient presents with  . Otalgia   (Consider location/radiation/quality/duration/timing/severity/associated sxs/prior Treatment) The history is provided by the patient.  Otalgia  Location:  Right Behind ear:  No abnormality Quality:  Aching and pressure Severity:  Moderate Onset quality:  Gradual Duration:  2 days Timing:  Constant Progression:  Worsening Chronicity:  New Context: not direct blow, not elevation change, not foreign body in ear, not loud noise, not recent URI and not water in ear   Relieved by:  OTC medications Worsened by:  Nothing Associated symptoms: no abdominal pain, no congestion, no cough, no diarrhea, no ear discharge, no fever, no hearing loss, no neck pain, no rhinorrhea, no sore throat, no tinnitus and no vomiting     Past Medical History:  Diagnosis Date  . Asthma   . Hypertension    Past Surgical History:  Procedure Laterality Date  . CESAREAN SECTION     History reviewed. No pertinent family history. Social History  Substance Use Topics  . Smoking status: Never Smoker  . Smokeless tobacco: Never Used  . Alcohol use Yes   OB History    No data available     Review of Systems  Constitutional: Negative for chills and fever.  HENT: Positive for ear pain. Negative for congestion, ear discharge, hearing loss, rhinorrhea, sore throat and tinnitus.   Eyes: Negative.   Respiratory: Negative for cough and shortness of breath.   Cardiovascular: Negative for chest pain and palpitations.  Gastrointestinal: Negative for abdominal pain, diarrhea, nausea and vomiting.  Musculoskeletal: Negative for myalgias, neck pain and neck stiffness.  Skin: Negative.   Neurological: Negative.     Allergies  Patient has no known allergies.  Home Medications   Prior to Admission medications   Medication Sig Start Date  End Date Taking? Authorizing Provider  albuterol (PROVENTIL HFA;VENTOLIN HFA) 108 (90 Base) MCG/ACT inhaler Inhale 2 puffs into the lungs every 4 (four) hours as needed for wheezing or shortness of breath. 06/13/16   Domenick Gong, MD  Fluticasone-Salmeterol (ADVAIR) 250-50 MCG/DOSE AEPB Inhale 1 puff into the lungs every 12 (twelve) hours as needed (for asthma). For wheezing/shortness of breath    [provider]  triamterene-hydrochlorothiazide (MAXZIDE-25) 37.5-25 MG tablet Take 1 tablet by mouth daily. 06/13/16   Domenick Gong, MD  valACYclovir (VALTREX) 500 MG tablet Take 1 tablet (500 mg total) by mouth daily. 06/13/16   Domenick Gong, MD   Meds Ordered and Administered this Visit  Medications - No data to display  BP (!) 156/92 (BP Location: Right Arm)   Pulse 71   Temp 98.4 F (36.9 C) (Oral)   Resp 20   SpO2 99%  No data found.   Physical Exam  Constitutional: She appears well-developed and well-nourished. No distress.  HENT:  Head: Normocephalic and atraumatic.  Right Ear: External ear normal. Tympanic membrane is bulging. Tympanic membrane is not injected and not erythematous. A middle ear effusion is present.  Left Ear: Tympanic membrane and external ear normal.  Nose: Nose normal.  Mouth/Throat: Oropharynx is clear and moist.  Eyes: Conjunctivae are normal. Right eye exhibits no discharge. Left eye exhibits no discharge.  Neck: Normal range of motion. Neck supple.  Cardiovascular: Normal rate and regular rhythm.   Pulmonary/Chest: Effort normal and breath sounds normal.  Lymphadenopathy:    She has  cervical adenopathy.  Neurological: She is alert.  Skin: Skin is warm and dry. Capillary refill takes less than 2 seconds. She is not diaphoretic.  Psychiatric: She has a normal mood and affect. Her behavior is normal.  Nursing note and vitals reviewed.   Urgent Care Course     Procedures (including critical care time)  Labs Review Labs Reviewed - No  data to display  Imaging Review No results found.     MDM   1. Otalgia of right ear    No signs of infection, there is effusion to the ear without erythema, continue OTC Zyrtec daily, add Flonase, follow up with PCP as needed. May continue OTC ibuprofen as needed.     Dorena BodoKennard, Kilani Joffe, NP 09/09/16 1123

## 2019-11-25 ENCOUNTER — Other Ambulatory Visit: Payer: Self-pay | Admitting: Obstetrics and Gynecology

## 2019-11-25 ENCOUNTER — Telehealth: Payer: Self-pay

## 2019-11-25 DIAGNOSIS — Z1231 Encounter for screening mammogram for malignant neoplasm of breast: Secondary | ICD-10-CM

## 2019-12-16 ENCOUNTER — Ambulatory Visit: Payer: Self-pay | Admitting: Dietician

## 2019-12-21 ENCOUNTER — Other Ambulatory Visit: Payer: Self-pay | Admitting: Physician Assistant

## 2019-12-21 ENCOUNTER — Other Ambulatory Visit (HOSPITAL_COMMUNITY)
Admission: RE | Admit: 2019-12-21 | Discharge: 2019-12-21 | Disposition: A | Discharge status: Home / Self Care | Payer: Self-pay | Source: Ambulatory Visit | Attending: Physician Assistant | Admitting: Physician Assistant

## 2019-12-21 DIAGNOSIS — Z124 Encounter for screening for malignant neoplasm of cervix: Secondary | ICD-10-CM | POA: Insufficient documentation

## 2019-12-22 ENCOUNTER — Ambulatory Visit: Payer: Self-pay | Admitting: Dietician

## 2019-12-23 ENCOUNTER — Ambulatory Visit: Payer: Self-pay

## 2019-12-23 LAB — CYTOLOGY - PAP
Comment: NEGATIVE
Diagnosis: NEGATIVE
High risk HPV: NEGATIVE

## 2020-01-08 ENCOUNTER — Other Ambulatory Visit: Payer: Self-pay

## 2020-01-08 DIAGNOSIS — R002 Palpitations: Secondary | ICD-10-CM | POA: Insufficient documentation

## 2020-01-08 DIAGNOSIS — T385X5A Adverse effect of other estrogens and progestogens, initial encounter: Secondary | ICD-10-CM | POA: Insufficient documentation

## 2020-01-08 DIAGNOSIS — Z5321 Procedure and treatment not carried out due to patient leaving prior to being seen by health care provider: Secondary | ICD-10-CM | POA: Insufficient documentation

## 2020-01-09 ENCOUNTER — Encounter (HOSPITAL_COMMUNITY): Payer: Self-pay

## 2020-01-09 ENCOUNTER — Emergency Department (HOSPITAL_COMMUNITY)
Admission: EM | Admit: 2020-01-09 | Discharge: 2020-01-09 | Disposition: A | Discharge status: Home / Self Care | Payer: Self-pay | Attending: Emergency Medicine | Admitting: Emergency Medicine

## 2020-01-09 NOTE — ED Triage Notes (Signed)
Pt reports that she started taking estradiol this morning and tonight after dinner she started feeling her heart racing. No pain or SOB noted noted.

## 2020-01-18 ENCOUNTER — Other Ambulatory Visit: Payer: Self-pay

## 2020-01-18 ENCOUNTER — Ambulatory Visit: Payer: Self-pay | Admitting: *Deleted

## 2020-01-18 ENCOUNTER — Ambulatory Visit
Admission: RE | Admit: 2020-01-18 | Discharge: 2020-01-18 | Disposition: A | Discharge status: Home / Self Care | Payer: No Typology Code available for payment source | Source: Ambulatory Visit | Attending: Obstetrics and Gynecology | Admitting: Obstetrics and Gynecology

## 2020-01-18 VITALS — BP 130/78 | Temp 98.2°F | Wt 154.2 lb

## 2020-01-18 DIAGNOSIS — Z1231 Encounter for screening mammogram for malignant neoplasm of breast: Secondary | ICD-10-CM

## 2020-01-18 DIAGNOSIS — Z1239 Encounter for other screening for malignant neoplasm of breast: Secondary | ICD-10-CM

## 2020-01-18 NOTE — Patient Instructions (Signed)
Explained breast self awareness with Karie Kirks. Patient did not need a Pap smear today due to last Pap smear and HPV typing was 8/17/32021. Let her know B[facility]CP will cover Pap smears and HPV typing every 5 years unless has a history of abnormal Pap smears. Referred patient to the [facility] for a screening mammogram. Appointment scheduled Tuesday, January 18, 2020 at 1230. Patient aware of appointment and will be there. Let patient know the Breast Center will follow up with her within the next couple weeks with results of her mammogram by letter or phone. Karie Kirks verbalized understanding.  Darcus Edds, Kathaleen Maser, RN 11:12 AM

## 2020-01-18 NOTE — Progress Notes (Signed)
Betty Johnson is a 59 y.o. female who presents to [facility] clinic today with no complaints.    Pap Smear: Pap not smear completed today. Last Pap smear was 12/21/2019 at [facility] at [facility] clinic and was normal with negative HPV. Per patient has no history of an abnormal Pap smear. Last Pap smear result is available in Epic.   Physical exam: Breasts Breasts symmetrical. No skin abnormalities bilateral breasts. No nipple retraction bilateral breasts. No nipple discharge bilateral breasts. No lymphadenopathy. No lumps palpated bilateral breasts. No complaints of pain or tenderness on exam.       Pelvic/Bimanual Pap is not indicated today per BCCCP guidelines.   Smoking History: Patient has never smoked.   Patient Navigation: Patient education provided. Access to services provided for patient through [facility] program. Transportation provided to patient to [facility] appointment.  Colorectal Cancer Screening: Per patient has never had colonoscopy completed. FIT Test completed in 2021 at [facility] and negative per patient. No complaints today.    Breast and Cervical Cancer Risk Assessment: Patient has family history of a maternal aunt having breast cancer. Patient has no known genetic mutations or history of radiation treatment to the chest before age 39. Patient does not have history of cervical dysplasia, immunocompromised, or DES exposure in-utero.  Risk Assessment    Risk Scores      01/18/2020   Last edited by: Narda Rutherford, LPN   5-year risk: 2.2 %   Lifetime risk: 10.9 %          A: BCCCP exam without pap smear No complaints.  P: Referred patient to the [facility] for a screening mammogram. Appointment scheduled Tuesday, January 18, 2020 at 1230.   Laney, [place], RN 01/18/2020 11:12 AM

## 2020-01-21 ENCOUNTER — Ambulatory Visit: Payer: Self-pay | Admitting: Dietician

## 2021-06-01 ENCOUNTER — Other Ambulatory Visit: Payer: Self-pay | Admitting: Home Modifications

## 2021-06-01 DIAGNOSIS — Z1231 Encounter for screening mammogram for malignant neoplasm of breast: Secondary | ICD-10-CM

## 2021-06-01 DIAGNOSIS — E2839 Other primary ovarian failure: Secondary | ICD-10-CM

## 2021-07-03 ENCOUNTER — Ambulatory Visit
Admission: RE | Admit: 2021-07-03 | Discharge: 2021-07-03 | Disposition: A | Discharge status: Home / Self Care | Payer: Managed Care, Other (non HMO) | Source: Ambulatory Visit | Attending: Home Modifications | Admitting: Home Modifications

## 2021-07-03 DIAGNOSIS — Z1231 Encounter for screening mammogram for malignant neoplasm of breast: Secondary | ICD-10-CM

## 2021-11-05 ENCOUNTER — Inpatient Hospital Stay: Admission: RE | Admit: 2021-11-05 | Payer: Self-pay | Source: Ambulatory Visit

## 2022-07-11 ENCOUNTER — Other Ambulatory Visit: Payer: Self-pay | Admitting: Family Medicine

## 2022-07-11 DIAGNOSIS — Z1231 Encounter for screening mammogram for malignant neoplasm of breast: Secondary | ICD-10-CM

## 2022-07-25 ENCOUNTER — Ambulatory Visit: Payer: Commercial Managed Care - HMO | Admitting: Obstetrics and Gynecology

## 2022-08-21 ENCOUNTER — Other Ambulatory Visit: Payer: Self-pay | Admitting: Family Medicine

## 2022-08-21 DIAGNOSIS — E2839 Other primary ovarian failure: Secondary | ICD-10-CM

## 2022-08-27 ENCOUNTER — Ambulatory Visit: Payer: Commercial Managed Care - HMO

## 2022-09-02 ENCOUNTER — Inpatient Hospital Stay: Admission: RE | Admit: 2022-09-02 | Payer: Commercial Managed Care - HMO | Source: Ambulatory Visit

## 2022-10-02 ENCOUNTER — Ambulatory Visit: Payer: Commercial Managed Care - HMO

## 2022-11-19 ENCOUNTER — Ambulatory Visit: Admission: RE | Admit: 2022-11-19 | Payer: Commercial Managed Care - HMO | Source: Ambulatory Visit

## 2022-11-19 DIAGNOSIS — Z1231 Encounter for screening mammogram for malignant neoplasm of breast: Secondary | ICD-10-CM

## 2022-12-28 DIAGNOSIS — H43391 Other vitreous opacities, right eye: Secondary | ICD-10-CM | POA: Diagnosis not present

## 2023-01-15 ENCOUNTER — Other Ambulatory Visit (HOSPITAL_BASED_OUTPATIENT_CLINIC_OR_DEPARTMENT_OTHER): Payer: Self-pay

## 2023-01-15 DIAGNOSIS — J4530 Mild persistent asthma, uncomplicated: Secondary | ICD-10-CM | POA: Diagnosis not present

## 2023-01-15 DIAGNOSIS — E559 Vitamin D deficiency, unspecified: Secondary | ICD-10-CM | POA: Diagnosis not present

## 2023-01-15 DIAGNOSIS — E2839 Other primary ovarian failure: Secondary | ICD-10-CM | POA: Diagnosis not present

## 2023-01-15 DIAGNOSIS — E669 Obesity, unspecified: Secondary | ICD-10-CM | POA: Diagnosis not present

## 2023-01-15 DIAGNOSIS — I10 Essential (primary) hypertension: Secondary | ICD-10-CM | POA: Diagnosis not present

## 2023-01-15 MED ORDER — LIDOCAINE 4 % EX GEL: 1.0000 | Freq: Three times a day (TID) | CUTANEOUS | 3 refills | Status: AC

## 2023-05-12 DIAGNOSIS — E785 Hyperlipidemia, unspecified: Secondary | ICD-10-CM | POA: Diagnosis not present

## 2023-05-12 DIAGNOSIS — R7303 Prediabetes: Secondary | ICD-10-CM | POA: Diagnosis not present

## 2023-05-12 DIAGNOSIS — Z01419 Encounter for gynecological examination (general) (routine) without abnormal findings: Secondary | ICD-10-CM | POA: Diagnosis not present

## 2023-05-12 DIAGNOSIS — E673 Hypervitaminosis D: Secondary | ICD-10-CM | POA: Diagnosis not present

## 2023-05-12 DIAGNOSIS — Z1329 Encounter for screening for other suspected endocrine disorder: Secondary | ICD-10-CM | POA: Diagnosis not present

## 2023-06-05 DIAGNOSIS — R0981 Nasal congestion: Secondary | ICD-10-CM | POA: Diagnosis not present

## 2023-06-05 DIAGNOSIS — J320 Chronic maxillary sinusitis: Secondary | ICD-10-CM | POA: Diagnosis not present

## 2023-06-05 DIAGNOSIS — R059 Cough, unspecified: Secondary | ICD-10-CM | POA: Diagnosis not present

## 2023-06-05 DIAGNOSIS — E782 Mixed hyperlipidemia: Secondary | ICD-10-CM | POA: Diagnosis not present

## 2023-06-05 DIAGNOSIS — Z20822 Contact with and (suspected) exposure to covid-19: Secondary | ICD-10-CM | POA: Diagnosis not present

## 2023-10-31 DIAGNOSIS — E782 Mixed hyperlipidemia: Secondary | ICD-10-CM | POA: Diagnosis not present

## 2023-10-31 DIAGNOSIS — E559 Vitamin D deficiency, unspecified: Secondary | ICD-10-CM | POA: Diagnosis not present

## 2023-10-31 DIAGNOSIS — R7303 Prediabetes: Secondary | ICD-10-CM | POA: Diagnosis not present

## 2023-10-31 DIAGNOSIS — Z0000 Encounter for general adult medical examination without abnormal findings: Secondary | ICD-10-CM | POA: Diagnosis not present

## 2023-10-31 DIAGNOSIS — E669 Obesity, unspecified: Secondary | ICD-10-CM | POA: Diagnosis not present

## 2023-10-31 DIAGNOSIS — I10 Essential (primary) hypertension: Secondary | ICD-10-CM | POA: Diagnosis not present

## 2023-12-09 ENCOUNTER — Other Ambulatory Visit: Payer: Self-pay | Admitting: Family Medicine

## 2023-12-09 DIAGNOSIS — Z1231 Encounter for screening mammogram for malignant neoplasm of breast: Secondary | ICD-10-CM

## 2023-12-22 DIAGNOSIS — Z1212 Encounter for screening for malignant neoplasm of rectum: Secondary | ICD-10-CM | POA: Diagnosis not present

## 2023-12-22 DIAGNOSIS — Z1211 Encounter for screening for malignant neoplasm of colon: Secondary | ICD-10-CM | POA: Diagnosis not present

## 2023-12-25 ENCOUNTER — Other Ambulatory Visit (HOSPITAL_BASED_OUTPATIENT_CLINIC_OR_DEPARTMENT_OTHER): Payer: Self-pay | Admitting: Family Medicine

## 2023-12-25 DIAGNOSIS — E2839 Other primary ovarian failure: Secondary | ICD-10-CM

## 2023-12-26 ENCOUNTER — Ambulatory Visit

## 2023-12-27 LAB — COLOGUARD: COLOGUARD: NEGATIVE

## 2024-01-22 DIAGNOSIS — Z1231 Encounter for screening mammogram for malignant neoplasm of breast: Secondary | ICD-10-CM | POA: Diagnosis not present
